# Patient Record
Sex: Male | Born: 1980 | Race: Black or African American | Hispanic: No | Marital: Married | Smoking: Never smoker
Health system: Southern US, Community
[De-identification: ages and names within clinical notes are randomized; demographics above are authoritative.]

## PROBLEM LIST (undated history)

## (undated) DIAGNOSIS — E119 Type 2 diabetes mellitus without complications: Secondary | ICD-10-CM

## (undated) DIAGNOSIS — I10 Essential (primary) hypertension: Secondary | ICD-10-CM

---

## 2012-11-14 ENCOUNTER — Encounter (HOSPITAL_COMMUNITY): Payer: Self-pay | Admitting: Emergency Medicine

## 2012-11-14 ENCOUNTER — Emergency Department (HOSPITAL_COMMUNITY)
Admission: EM | Admit: 2012-11-14 | Discharge: 2012-11-14 | Disposition: A | Discharge status: Home / Self Care | Payer: BC Managed Care – PPO | Source: Home / Self Care | Attending: Emergency Medicine | Admitting: Emergency Medicine

## 2012-11-14 DIAGNOSIS — J069 Acute upper respiratory infection, unspecified: Secondary | ICD-10-CM

## 2012-11-14 MED ORDER — SALINE NASAL SPRAY 0.65 % NA SOLN: 1.0000 | NASAL | Status: DC | PRN

## 2012-11-14 MED ORDER — CETIRIZINE-PSEUDOEPHEDRINE ER 5-120 MG PO TB12: 1.0000 | ORAL_TABLET | Freq: Two times a day (BID) | ORAL | Status: DC

## 2012-11-14 NOTE — ED Provider Notes (Signed)
History     CSN: [id number]  Arrival date & time 11/14/12  1529   First MD Initiated Contact with Patient 11/14/12 1629      Chief Complaint  Patient presents with  . URI    (Consider location/radiation/quality/duration/timing/severity/associated sxs/prior treatment) Patient is a 32 y.o. male presenting with URI. The history is provided by the patient.  URI The primary symptoms include cough. The current episode started 2 days ago. This is a new problem. The problem has not changed since onset. The cough is non-productive. There is nondescript sputum produced.  The onset of the illness is associated with exposure to sick contacts. Symptoms associated with the illness include chills, congestion and rhinorrhea. The illness is not associated with plugged ear sensation, facial pain or sinus pressure. The following treatments were addressed: Acetaminophen was effective. A decongestant was effective. Aspirin was effective. NSAIDs were effective.  Pt reports he was ill 2 days ago with abdominal pain, diarrhea and fatigue, states those symptom have improved but now has congestion with sore throat and nonproductive of cough.   Has taken nyquil and ibuprofen for symptoms with no relief.    History reviewed. No pertinent past medical history.  History reviewed. No pertinent past surgical history.  No family history on file.  History  Substance Use Topics  . Smoking status: Never Smoker   . Smokeless tobacco: Not on file  . Alcohol Use: Yes      Review of Systems  Constitutional: Positive for chills.  HENT: Positive for congestion and rhinorrhea. Negative for sinus pressure.   Respiratory: Positive for cough.   All other systems reviewed and are negative.    Allergies  Review of patient's allergies indicates no known allergies.  Home Medications   Current Outpatient Rx  Name  Route  Sig  Dispense  Refill  . CETIRIZINE-PSEUDOEPHEDRINE ER 5-120 MG PO TB12   Oral   Take 1 tablet  by mouth 2 (two) times daily.   30 tablet   1   . SALINE NASAL SPRAY 0.65 % NA SOLN   Nasal   Place 1 spray into the nose as needed for congestion.   30 mL   12     There were no vitals taken for this visit.  Physical Exam  Nursing note and vitals reviewed. Constitutional: He is oriented to person, place, and time. Vital signs are normal. He appears well-developed and well-nourished. He is active and cooperative.  HENT:  Head: Normocephalic.  Right Ear: Tympanic membrane and external ear normal.  Left Ear: Tympanic membrane and external ear normal.  Nose: Nose normal. Right sinus exhibits no maxillary sinus tenderness and no frontal sinus tenderness. Left sinus exhibits no maxillary sinus tenderness and no frontal sinus tenderness.  Mouth/Throat: Uvula is midline, oropharynx is clear and moist and mucous membranes are normal. No oropharyngeal exudate.  Eyes: Conjunctivae normal and EOM are normal. Pupils are equal, round, and reactive to light. No scleral icterus.  Neck: Trachea normal and normal range of motion. Neck supple. No thyromegaly present.  Cardiovascular: Normal rate, regular rhythm, normal heart sounds and intact distal pulses.   Pulmonary/Chest: Effort normal and breath sounds normal.  Abdominal: Soft. There is no tenderness. There is no rebound and no guarding.  Lymphadenopathy:    He has no cervical adenopathy.  Neurological: He is alert and oriented to person, place, and time. No cranial nerve deficit or sensory deficit.  Skin: Skin is warm and dry.  Psychiatric: He has a normal  mood and affect. His speech is normal and behavior is normal. Judgment and thought content normal. Cognition and memory are normal.    ED Course  Procedures (including critical care time)   Labs Reviewed  POCT RAPID STREP A ([facility] ONLY)   No results found.   1. URI (upper respiratory infection)       MDM  Rapid step negative.  Increase fluid intake, rest.  Antibiotics  not indicated.  Begin expectorate, topical decongestant, saline nasal spray and cough suppressant at bedtime. Antihistamines of your choice (Claritin or Zyrtec).  Tylenol or Motrin for fever/discomfort.  Followup with PCP if not improving 5 to 7 days.         Johnsie Kindred, NP 11/14/12 1704

## 2012-11-14 NOTE — ED Notes (Signed)
Pt c/o cold sx x3 days Sx include: abd pain that has resolved, chills, nasal congestion, sore throat, dry cough, diarrhea until yest Normal bowel movement today Denies: fevers, nauseas, vomiting Daughter had the cold last week  He is alert w/no signs of acute distress.

## 2012-11-14 NOTE — ED Provider Notes (Signed)
Medical screening examination/treatment/procedure(s) were performed by non-physician practitioner and as supervising physician I was immediately available for consultation/collaboration.  Raynald Blend, MD 11/14/12 1734

## 2013-03-04 ENCOUNTER — Encounter (HOSPITAL_COMMUNITY): Payer: Self-pay | Admitting: Emergency Medicine

## 2013-03-04 ENCOUNTER — Emergency Department (HOSPITAL_COMMUNITY)
Admission: EM | Admit: 2013-03-04 | Discharge: 2013-03-04 | Disposition: A | Discharge status: Home / Self Care | Payer: BC Managed Care – PPO | Source: Home / Self Care | Attending: Emergency Medicine | Admitting: Emergency Medicine

## 2013-03-04 ENCOUNTER — Emergency Department (INDEPENDENT_AMBULATORY_CARE_PROVIDER_SITE_OTHER): Payer: BC Managed Care – PPO

## 2013-03-04 DIAGNOSIS — S335XXA Sprain of ligaments of lumbar spine, initial encounter: Secondary | ICD-10-CM

## 2013-03-04 DIAGNOSIS — Q762 Congenital spondylolisthesis: Secondary | ICD-10-CM

## 2013-03-04 DIAGNOSIS — M4310 Spondylolisthesis, site unspecified: Secondary | ICD-10-CM

## 2013-03-04 MED ORDER — HYDROCODONE-IBUPROFEN 7.5-200 MG PO TABS: 1.0000 | ORAL_TABLET | Freq: Three times a day (TID) | ORAL | Status: DC | PRN

## 2013-03-04 NOTE — ED Provider Notes (Addendum)
History     CSN: [id number]  Arrival date & time 03/04/13  1813   First MD Initiated Contact with Patient 03/04/13 1850      Chief Complaint  Patient presents with  . Back Pain    (Consider location/radiation/quality/duration/timing/severity/associated sxs/prior treatment) HPI Comments: Patient presents urgent care this evening complaining of ongoing lower back pain. Patient describes that Saturday he recalls pulling an electrical cord and felt a pulling on his back since then he's been sore in his lower back, which he describes as a sharp type pain, when he moves or leans forward. Patient went to another urgent care was prescribed naproxen and a muscle relaxer. Although he is moving better he still feels with discomfort on his lower back. Patient denies any other associated symptoms.  Patient is a 32 y.o. male presenting with back pain. The history is provided by the patient.  Back Pain Location:  Lumbar spine Quality:  Aching Pain severity:  Moderate Onset quality:  Sudden Duration:  4 days Timing:  Constant Progression:  Unchanged Relieved by:  NSAIDs and being still Worsened by:  Movement, coughing, ambulation and bending Associated symptoms: no abdominal pain, no bladder incontinence, no bowel incontinence, no dysuria, no fever, no numbness, no paresthesias, no perianal numbness, no tingling and no weakness   Risk factors: lack of exercise and obesity   Risk factors: no hx of cancer, not pregnant and no recent surgery     History reviewed. No pertinent past medical history.  History reviewed. No pertinent past surgical history.  No family history on file.  History  Substance Use Topics  . Smoking status: Never Smoker   . Smokeless tobacco: Not on file  . Alcohol Use: Yes      Review of Systems  Constitutional: Negative for fever, chills, diaphoresis, activity change, appetite change and fatigue.  Gastrointestinal: Negative for abdominal pain and bowel  incontinence.  Genitourinary: Negative for bladder incontinence, dysuria, frequency, hematuria, flank pain, genital sores and penile pain.  Musculoskeletal: Positive for back pain.  Skin: Negative for color change, rash and wound.  Neurological: Negative for tingling, weakness, numbness and paresthesias.    Allergies  Review of patient's allergies indicates no known allergies.  Home Medications   Current Outpatient Rx  Name  Route  Sig  Dispense  Refill  . methocarbamol (ROBAXIN) 500 MG tablet   Oral   Take 500 mg by mouth 4 (four) times daily.         . cetirizine-pseudoephedrine (ZYRTEC-D) 5-120 MG per tablet   Oral   Take 1 tablet by mouth 2 (two) times daily.   30 tablet   1   . HYDROcodone-ibuprofen (VICOPROFEN) 7.5-200 MG per tablet   Oral   Take 1 tablet by mouth every 8 (eight) hours as needed for pain.   15 tablet   0   . sodium chloride (OCEAN NASAL SPRAY) 0.65 % nasal spray   Nasal   Place 1 spray into the nose as needed for congestion.   30 mL   12     BP 138/92  Pulse 91  Temp(Src) 98.2 F (36.8 C) (Oral)  Resp 18  SpO2 99%  Physical Exam  Nursing note and vitals reviewed. Constitutional: He is oriented to person, place, and time. No distress.  Musculoskeletal: He exhibits tenderness.       Lumbar back: He exhibits decreased range of motion, tenderness and bony tenderness. He exhibits no swelling, no edema, no deformity, no laceration, no spasm and  normal pulse.       Back:  Neurological: He is alert and oriented to person, place, and time. He exhibits normal muscle tone.  Skin: No rash noted. No erythema.    ED Course  Procedures (including critical care time)  Labs Reviewed - No data to display Dg Lumbar Spine Complete  03/04/2013  *RADIOLOGY REPORT*  Clinical Data: Low back pain.  LUMBAR SPINE - COMPLETE 4+ VIEW  Comparison: None.  Findings: Five lumbar type vertebral bodies are present.  There is a dysmorphic appearance of the L5  vertebra with failure fusion of the posterior elements compatible with spina bifida occulta.  There also appear to be bilateral L5 pars defects without spondylolisthesis.  Vertebral body height and intervertebral disc spaces are preserved.  Sacral arcades appear normal.  SI joints appear normal.  IMPRESSION: 1.  Bilateral L5 spondylolysis without spondylolisthesis. 2.  Spina bifida occulta at L5.   Original Report Authenticated By: Andreas Newport, M.D.      1. Lumbar sprain, initial encounter   2. Spondylolisthesis    spina bifida occulta    MDM  Lumbar sprain, patient was prescribed Vicoprofen and refer to followup with the orthopedic Dr. if pain was to persist beyond 10-14 days.        Jimmie Molly, MD 03/04/13 1936  Jimmie Molly, MD 03/04/13 2032

## 2013-03-04 NOTE — ED Notes (Signed)
Pt c/o lower right back pain onset Saturday Recalls pulling an electric cord and felt something pull on back Seen at another Urgent Care and given Naproxen and Methocarbamol; temp relief Pain is intermittent; increases w/activity and when lying down  He is alert and oriented w/no signs of acute distress.

## 2013-03-04 NOTE — Discharge Instructions (Signed)

## 2013-11-23 ENCOUNTER — Emergency Department (HOSPITAL_COMMUNITY)
Admission: EM | Admit: 2013-11-23 | Discharge: 2013-11-23 | Disposition: A | Discharge status: Home / Self Care | Payer: BC Managed Care – PPO | Source: Home / Self Care | Attending: Emergency Medicine | Admitting: Emergency Medicine

## 2013-11-23 DIAGNOSIS — L0100 Impetigo, unspecified: Secondary | ICD-10-CM

## 2013-11-23 DIAGNOSIS — B86 Scabies: Secondary | ICD-10-CM

## 2013-11-23 MED ORDER — TETANUS-DIPHTH-ACELL PERTUSSIS 5-2.5-18.5 LF-MCG/0.5 IM SUSP: 0.5000 mL | Freq: Once | INTRAMUSCULAR | Status: DC

## 2013-11-23 MED ORDER — AMOXICILLIN-POT CLAVULANATE 875-125 MG PO TABS: 1.0000 | ORAL_TABLET | Freq: Two times a day (BID) | ORAL | Status: DC

## 2013-11-23 MED ORDER — PERMETHRIN 5 % EX CREA: TOPICAL_CREAM | CUTANEOUS | Status: DC

## 2013-11-23 NOTE — ED Notes (Signed)
Patient states started with a rash in his groin area about three weeks ago Since than has spread to torso and arms Past weeks broke out with blisters to bi lateral feet Very itchy Has been using OTC remedies with no relief Today has some redness around the blisters on his feet

## 2013-11-23 NOTE — ED Provider Notes (Signed)
Medical screening examination/treatment/procedure(s) were performed by non-physician practitioner and as supervising physician I was immediately available for consultation/collaboration.  Maciej Schweitzer, M.D.  Halvor Behrend C Nitzia Perren, MD 11/23/13 2137 

## 2013-11-23 NOTE — ED Provider Notes (Signed)
CSN: [id number]     Arrival date & time 11/23/13  1520 History   First MD Initiated Contact with Patient 11/23/13 1638     Chief Complaint  Patient presents with  . Rash  . Blister   (Consider location/radiation/quality/duration/timing/severity/associated sxs/prior Treatment) HPI Comments: Patient presents with a three week history of a rash that initially began on his inner thighs, spread to groin folds, up onto his torso, down both arms and then developed on his feet. Rash is pruritic. No malaise or fever. Areas on medial feet have developed some blistering and redness, but are minimally tender. Denies any known exposures and other members of the household do not have a similar rash.  PCP: [facility]  Patient is a 33 y.o. male presenting with rash. The history is provided by the patient.  Rash   No past medical history on file. No past surgical history on file. No family history on file. History  Substance Use Topics  . Smoking status: Never Smoker   . Smokeless tobacco: Not on file  . Alcohol Use: Yes    Review of Systems  Skin: Positive for rash.  All other systems reviewed and are negative.    Allergies  Review of patient's allergies indicates no known allergies.  Home Medications   Current Outpatient Rx  Name  Route  Sig  Dispense  Refill  . cetirizine-pseudoephedrine (ZYRTEC-D) 5-120 MG per tablet   Oral   Take 1 tablet by mouth 2 (two) times daily.   30 tablet   1   . HYDROcodone-ibuprofen (VICOPROFEN) 7.5-200 MG per tablet   Oral   Take 1 tablet by mouth every 8 (eight) hours as needed for pain.   15 tablet   0   . methocarbamol (ROBAXIN) 500 MG tablet   Oral   Take 500 mg by mouth 4 (four) times daily.         . sodium chloride (OCEAN NASAL SPRAY) 0.65 % nasal spray   Nasal   Place 1 spray into the nose as needed for congestion.   30 mL   12    BP 160/98  Pulse 95  Temp(Src) 99 F (37.2 C) (Oral)  Resp 18  SpO2 99% Physical Exam   Nursing note and vitals reviewed. Constitutional: He is oriented to person, place, and time. He appears well-developed. No distress.  +obese  HENT:  Head: Normocephalic and atraumatic.  No mucous membrane lesions  Eyes: Conjunctivae are normal. Right eye exhibits no discharge. Left eye exhibits no discharge. No scleral icterus.  Cardiovascular: Normal rate.   Pulmonary/Chest: Effort normal.  Musculoskeletal: Normal range of motion.  Neurological: He is alert and oriented to person, place, and time.  Skin: Skin is warm and dry. Rash noted.  Maculopapular rash with diffuse distribution over torso, bilateral upper extremities and at feet. Heavily concentrated in groin folds, waistline, feet and anticubital fossa of both arms. At feet, lesions at medial plantar surfaces of feet are small fluid filled blisters with surrounding erythema. No induration or tenderness.   Psychiatric: He has a normal mood and affect. His behavior is normal.    ED Course  Procedures (including critical care time) Labs Review Labs Reviewed - No data to display Imaging Review No results found.  EKG Interpretation    Date/Time:    Ventricular Rate:    PR Interval:    QRS Duration:   QT Interval:    QTC Calculation:   R Axis:     Text Interpretation:  MDM  Distribution and appearance suggests scabies with areas of secondary infection at both feet. Patient is non-toxic and afebrile. Also made patient aware of elevated blood pressure and advised to follow up with PCP in 5-7 days for both BP recheck and to ensure that rash and condition of feet are improving. Elimite for scabies and augmentin for areas of secondary infection at feet. Will update tetanus status given open wounds at feet and last booster unknown.    Jess BartersJennifer Darrow [place], [place] 11/23/13 [phone]

## 2014-02-09 IMAGING — CR DG LUMBAR SPINE COMPLETE 4+V
5 series · 5 of 5 positions shown · non-contrast
Comparison: None.

CLINICAL DATA: Low back pain.

LUMBAR SPINE - COMPLETE 4+ VIEW

[view not recorded (1 of 5)]
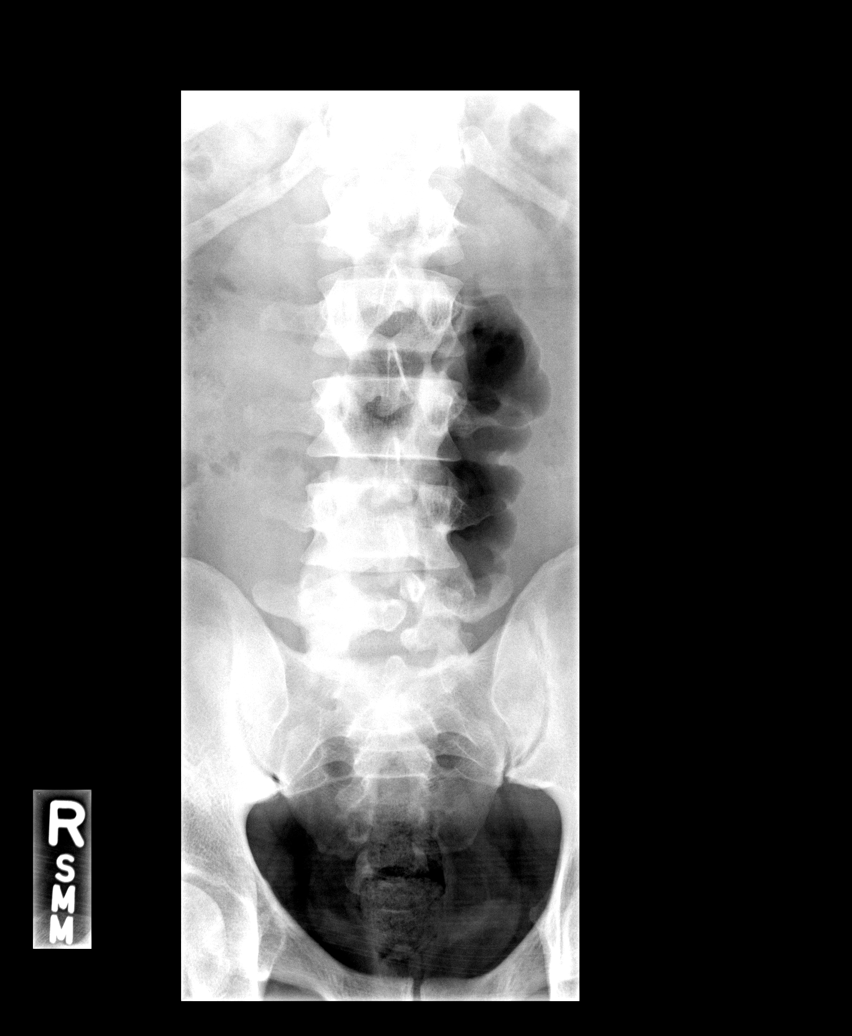

[view not recorded (2 of 5)]
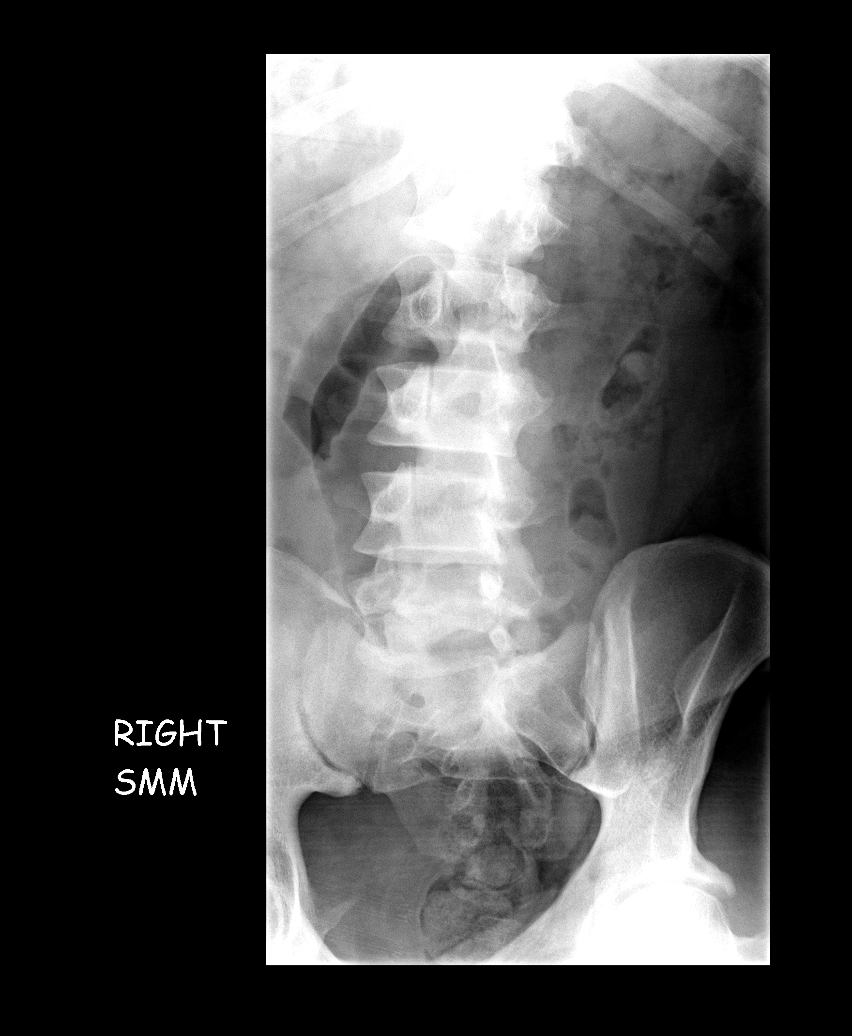

[view not recorded (3 of 5)]
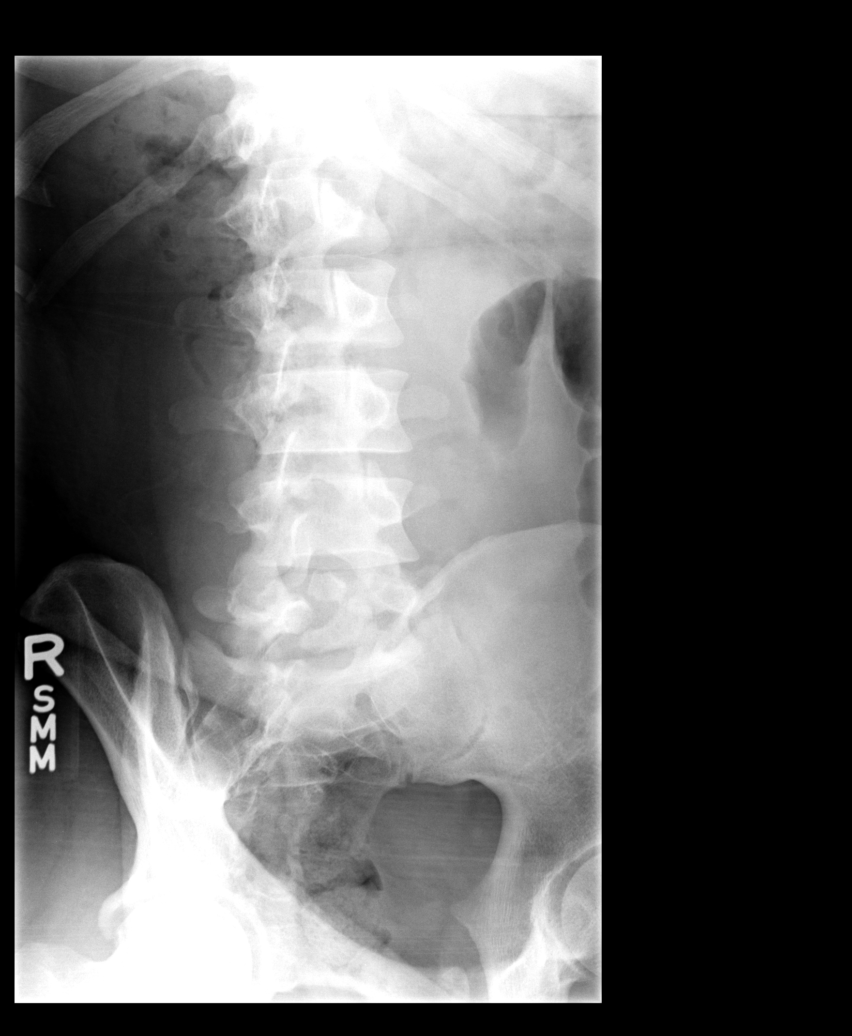

[view not recorded (4 of 5)]
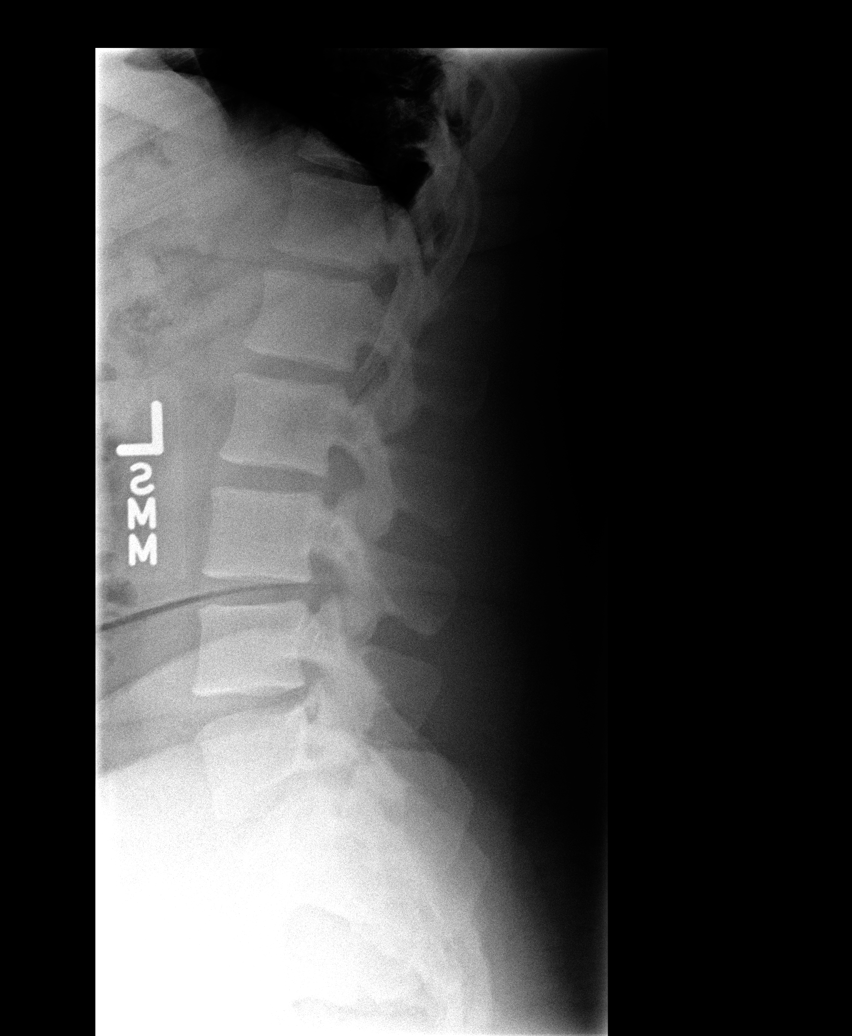

[view not recorded (5 of 5)]
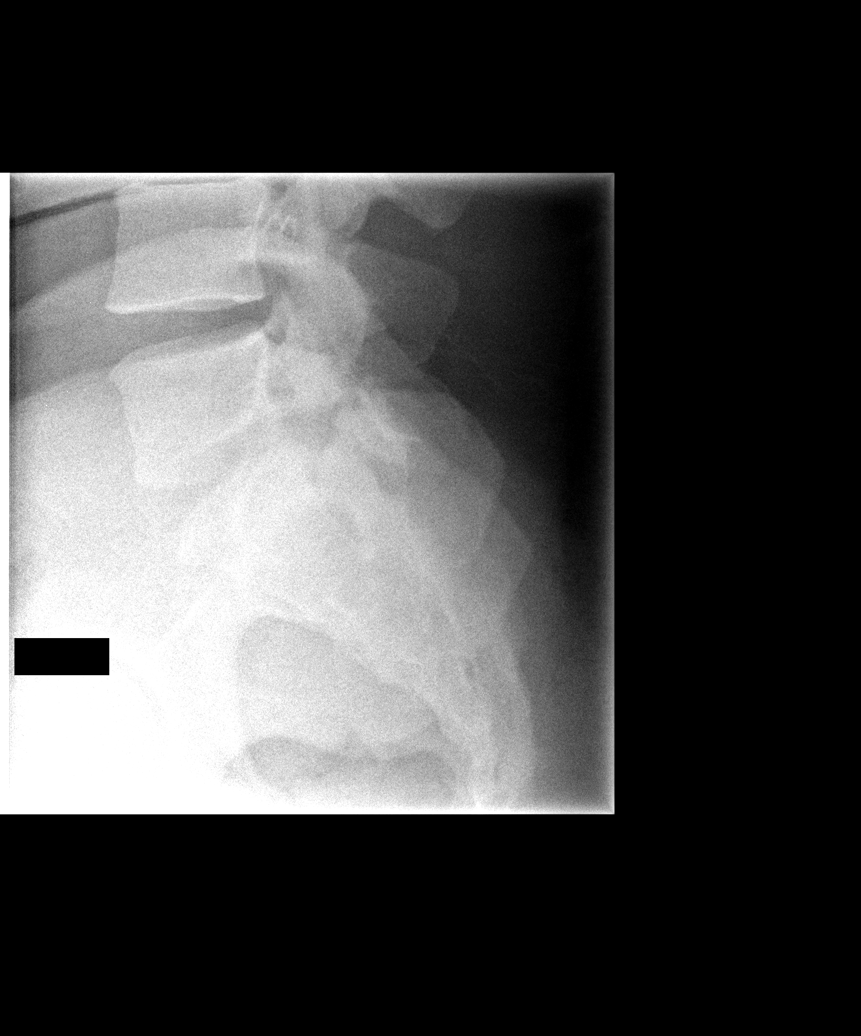

[5 of 5 positions shown; findings below may reference images not displayed]

FINDINGS: Five lumbar type vertebral bodies are present.  There is
a dysmorphic appearance of the L5 vertebra with failure fusion of
the posterior elements compatible with spina bifida occulta.  There
also appear to be bilateral L5 pars defects without
spondylolisthesis.  Vertebral body height and intervertebral disc
spaces are preserved.  Sacral arcades appear normal.  SI joints
appear normal.
IMPRESSION: 1.  Bilateral L5 spondylolysis without spondylolisthesis.
2.  Spina bifida occulta at L5.

## 2018-09-07 ENCOUNTER — Other Ambulatory Visit: Payer: Self-pay

## 2018-09-07 ENCOUNTER — Encounter (HOSPITAL_COMMUNITY): Payer: Self-pay | Admitting: *Deleted

## 2018-09-07 ENCOUNTER — Ambulatory Visit (HOSPITAL_COMMUNITY)
Admission: EM | Admit: 2018-09-07 | Discharge: 2018-09-07 | Disposition: A | Discharge status: Home / Self Care | Payer: 59 | Attending: Family Medicine | Admitting: Family Medicine

## 2018-09-07 DIAGNOSIS — K122 Cellulitis and abscess of mouth: Secondary | ICD-10-CM | POA: Diagnosis not present

## 2018-09-07 MED ORDER — TRAMADOL HCL 50 MG PO TABS: 50.0000 mg | ORAL_TABLET | Freq: Four times a day (QID) | ORAL | 0 refills | Status: DC | PRN

## 2018-09-07 MED ORDER — DOXYCYCLINE HYCLATE 100 MG PO CAPS: 100.0000 mg | ORAL_CAPSULE | Freq: Two times a day (BID) | ORAL | 0 refills | Status: DC

## 2018-09-07 NOTE — ED Provider Notes (Signed)
[facility]    CSN: [id number] Arrival date & time: 09/07/18  1427     History   Chief Complaint No chief complaint on file.   HPI Jonathan Delgado is a 37 y.o. male.   HPI  Patient complains of worsening right upper jaw and facial pain.  He reports needing dental work and has scheduled an appointment with dentist.  Next available appointment is scheduled for several weeks out.  He is concerned for having a gum abscess.  He has a broken tooth which was previously filled in the filling came out well back.  He never went to have this repaired. Affected tooth is an upper right molar.  Pain is about a 5-6 out of 10 although at night worsens.  He denies fever although has had some chills and complains of just not feeling well and increased fatigued. History reviewed. No pertinent past medical history.  There are no active problems to display for this patient.   History reviewed. No pertinent surgical history.     Home Medications    Prior to Admission medications   Medication Sig Start Date End Date Taking? Authorizing Provider  cetirizine-pseudoephedrine (ZYRTEC-D) 5-120 MG per tablet Take 1 tablet by mouth 2 (two) times daily. 11/14/12   Chatten, Katherine Basset, NP  doxycycline (VIBRAMYCIN) 100 MG capsule Take 1 capsule (100 mg total) by mouth 2 (two) times daily. 09/07/18   Bing Neighbors, FNP  HYDROcodone-ibuprofen (VICOPROFEN) 7.5-200 MG per tablet Take 1 tablet by mouth every 8 (eight) hours as needed for pain. 03/04/13   Jimmie Molly, MD  methocarbamol (ROBAXIN) 500 MG tablet Take 500 mg by mouth 4 (four) times daily.    [provider]  permethrin (ELIMITE) 5 % cream Apply to affected area once from chin to toes, leave eight hours then shower. Repeat treatment x 1 one week after initial treatment 11/23/13   Ria Clock, PA  sodium chloride (OCEAN NASAL SPRAY) 0.65 % nasal spray Place 1 spray into the nose as needed for congestion. 11/14/12   Chatten, Katherine Basset,  NP  traMADol (ULTRAM) 50 MG tablet Take 1 tablet (50 mg total) by mouth every 6 (six) hours as needed. 09/07/18   Bing Neighbors, FNP    Family History History reviewed. No pertinent family history.  Social History Social History   Tobacco Use  . Smoking status: Never Smoker  . Smokeless tobacco: Never Used  Substance Use Topics  . Alcohol use: Yes  . Drug use: No     Allergies   Patient has no known allergies.   Review of Systems Review of Systems Pertinent negatives listed in HPI  Physical Exam Triage Vital Signs ED Triage Vitals  Enc Vitals Group     BP 09/07/18 1557 (!) 161/100     Pulse Rate 09/07/18 1557 (!) 106     Resp 09/07/18 1557 18     Temp 09/07/18 1557 98.8 F (37.1 C)     Temp Source 09/07/18 1557 Oral     SpO2 09/07/18 1557 100 %     Weight --      Height --      Head Circumference --      Peak Flow --      Pain Score 09/07/18 1552 6     Pain Loc --      Pain Edu? --      Excl. in GC? --    No data found.  Updated Vital Signs BP Marland Kitchen)  161/100 (BP Location: Left Arm)   Pulse (!) 106   Temp 98.8 F (37.1 C) (Oral)   Resp 18   SpO2 100%   Visual Acuity Right Eye Distance:   Left Eye Distance:   Bilateral Distance:    Right Eye Near:   Left Eye Near:    Bilateral Near:     Physical Exam General appearance: alert, well developed, well nourished, cooperative and in no distress Head: Normocephalic, without obvious abnormality, atraumatic Heart:Tachycardia. Negative for murmur or gallops. Respiratory: Respirations even and unlabored, normal respiratory rate Extremities: No gross deformities Skin: Skin color, texture, turgor normal. No rashes seen  Psych: Appropriate mood and affect. Neurologic: Mental status: Alert, oriented to person, place, and time, thought content appropriate.  UC Treatments / Results  Labs (all labs ordered are listed, but only abnormal results are displayed) Labs Reviewed - No data to  display  EKG None  Radiology No results found.  Procedures Procedures (including critical care time)  Medications Ordered in UC Medications - No data to display  Initial Impression / Assessment and Plan / UC Course  I have reviewed the triage vital signs and the nursing notes.  Pertinent labs & imaging results that were available during my care of the patient were reviewed by me and considered in my medical decision making (see chart for details).   Patient presents for acute dental pain. He has very poor dentition. There is a small abscess noted above the gum of the right upper molar tooth number #3 and #4. Will treat with a broad-spectrum antibiotic. Continue naproxen or ibuprofen for pain (not both). Short course of Tramadol prescribed to help manage severe pain only. An After Visit Summary was printed and given to the patient/family. Precautions discussed. Red flags discussed. Questions invited and answered.They voiced understanding and agreement.  Final Clinical Impressions(s) / UC Diagnoses   Final diagnoses:  Oral abscess     Discharge Instructions     Follow-up with dentist if no improvement.    ED Prescriptions    Medication Sig Dispense Auth. Provider   traMADol (ULTRAM) 50 MG tablet  (Status: Discontinued) Take 1 tablet (50 mg total) by mouth every 6 (six) hours as needed. 15 tablet Bing Neighbors, FNP   traMADol (ULTRAM) 50 MG tablet Take 1 tablet (50 mg total) by mouth every 6 (six) hours as needed. 15 tablet Bing Neighbors, FNP   doxycycline (VIBRAMYCIN) 100 MG capsule Take 1 capsule (100 mg total) by mouth 2 (two) times daily. 20 capsule Bing Neighbors, FNP     Controlled Substance Prescriptions [place] Controlled Substance Registry consulted? Yes, I have consulted the [place] Controlled Substances Registry for this patient, and feel the risk/benefit ratio today is favorable for proceeding with this prescription for a controlled substance.   Bing Neighbors, FNP 09/09/18 [phone]

## 2018-09-07 NOTE — Discharge Instructions (Signed)
Follow up with dentist if no improvement. °

## 2018-09-07 NOTE — ED Triage Notes (Signed)
Started Thursday morning, cold sweats, right side of face severe pain,

## 2023-12-31 ENCOUNTER — Telehealth (HOSPITAL_COMMUNITY): Payer: Self-pay

## 2023-12-31 ENCOUNTER — Ambulatory Visit (HOSPITAL_COMMUNITY)
Admission: EM | Admit: 2023-12-31 | Discharge: 2023-12-31 | Disposition: A | Discharge status: Home / Self Care | Payer: 59 | Attending: Family Medicine | Admitting: Family Medicine

## 2023-12-31 ENCOUNTER — Encounter (HOSPITAL_COMMUNITY): Payer: Self-pay | Admitting: Emergency Medicine

## 2023-12-31 DIAGNOSIS — J019 Acute sinusitis, unspecified: Secondary | ICD-10-CM | POA: Diagnosis not present

## 2023-12-31 HISTORY — DX: Essential (primary) hypertension: I10

## 2023-12-31 HISTORY — DX: Type 2 diabetes mellitus without complications: E11.9

## 2023-12-31 MED ORDER — CEFDINIR 300 MG PO CAPS: 600.0000 mg | ORAL_CAPSULE | Freq: Every day | ORAL | 0 refills | Status: AC

## 2023-12-31 MED ORDER — BENZONATATE 100 MG PO CAPS: 100.0000 mg | ORAL_CAPSULE | Freq: Three times a day (TID) | ORAL | 0 refills | Status: DC | PRN

## 2023-12-31 MED ORDER — BENZONATATE 100 MG PO CAPS: 100.0000 mg | ORAL_CAPSULE | Freq: Three times a day (TID) | ORAL | 0 refills | Status: AC | PRN

## 2023-12-31 MED ORDER — PREDNISONE 20 MG PO TABS: 40.0000 mg | ORAL_TABLET | Freq: Every day | ORAL | 0 refills | Status: DC

## 2023-12-31 MED ORDER — CEFDINIR 300 MG PO CAPS: 600.0000 mg | ORAL_CAPSULE | Freq: Every day | ORAL | 0 refills | Status: DC

## 2023-12-31 MED ORDER — PREDNISONE 20 MG PO TABS: 40.0000 mg | ORAL_TABLET | Freq: Every day | ORAL | 0 refills | Status: AC

## 2023-12-31 NOTE — ED Triage Notes (Signed)
 Pt reports for a couple weeks having right side headache and sinus congestion, then next week felt like he had the flu. Acetaminophen for the pains.  Saturday starting having left sided headache. Hasn't taken anything for pain today. Pt reports lots of pain behind left eye.   Denies any visual impairment.

## 2023-12-31 NOTE — ED Notes (Signed)
 [facility] on [address] is closed. Patient requested we call in medication to [facility] on [address].

## 2023-12-31 NOTE — ED Provider Notes (Signed)
 [facility]    CSN: [id number] Arrival date & time: 12/31/23  1549      History   Chief Complaint Chief Complaint  Patient presents with   Headache    HPI Nussen Pullin is a 43 y.o. male.    Headache Here for sinus pressure and headache, postnasal drainage and rhinorrhea.  Symptoms began about 2 weeks ago.  At first he had cough and some fever.  That has improved he had some chest congestion but never felt tight or wheezy.   NKDA He does have a history of diabetes but sugars well-controlled   Past Medical History:  Diagnosis Date   Diabetes mellitus without complication (HCC)    Hypertension     There are no active problems to display for this patient.   History reviewed. No pertinent surgical history.     Home Medications    Prior to Admission medications   Medication Sig Start Date End Date Taking? Authorizing Provider  benzonatate (TESSALON) 100 MG capsule Take 1 capsule (100 mg total) by mouth 3 (three) times daily as needed for cough. 12/31/23  Yes Zenia Resides, MD  cefdinir (OMNICEF) 300 MG capsule Take 2 capsules (600 mg total) by mouth daily for 7 days. 12/31/23 01/07/24 Yes Zenia Resides, MD  predniSONE (DELTASONE) 20 MG tablet Take 2 tablets (40 mg total) by mouth daily with breakfast for 3 days. 12/31/23 01/03/24 Yes Zenia Resides, MD    Family History No family history on file.  Social History Social History   Tobacco Use   Smoking status: Never   Smokeless tobacco: Never  Substance Use Topics   Alcohol use: Yes   Drug use: No     Allergies   Patient has no known allergies.   Review of Systems Review of Systems  Neurological:  Positive for headaches.     Physical Exam Triage Vital Signs ED Triage Vitals  Encounter Vitals Group     BP 12/31/23 1659 (!) 175/113     Systolic BP Percentile --      Diastolic BP Percentile --      Pulse Rate 12/31/23 1659 98     Resp 12/31/23 1659 15     Temp 12/31/23 1659 98  F (36.7 C)     Temp Source 12/31/23 1659 Oral     SpO2 12/31/23 1659 97 %     Weight --      Height --      Head Circumference --      Peak Flow --      Pain Score 12/31/23 1658 6     Pain Loc --      Pain Education --      Exclude from Growth Chart --    No data found.  Updated Vital Signs BP (!) 175/113 (BP Location: Right Arm) Comment: hasnt taken HTN meds since last year  Pulse 98   Temp 98 F (36.7 C) (Oral)   Resp 15   SpO2 97%   Visual Acuity Right Eye Distance:   Left Eye Distance:   Bilateral Distance:    Right Eye Near:   Left Eye Near:    Bilateral Near:     Physical Exam Vitals reviewed.  Constitutional:      General: He is not in acute distress.    Appearance: He is not ill-appearing, toxic-appearing or diaphoretic.  HENT:     Right Ear: Tympanic membrane and ear canal normal.     Left  Ear: Tympanic membrane and ear canal normal.     Nose: Congestion present.     Mouth/Throat:     Mouth: Mucous membranes are moist.     Comments: White mucus is draining Eyes:     Extraocular Movements: Extraocular movements intact.     Conjunctiva/sclera: Conjunctivae normal.     Pupils: Pupils are equal, round, and reactive to light.  Cardiovascular:     Rate and Rhythm: Normal rate and regular rhythm.     Heart sounds: No murmur heard. Pulmonary:     Effort: Pulmonary effort is normal. No respiratory distress.     Breath sounds: Normal breath sounds. No stridor. No wheezing, rhonchi or rales.  Musculoskeletal:     Cervical back: Neck supple.  Lymphadenopathy:     Cervical: No cervical adenopathy.  Skin:    Capillary Refill: Capillary refill takes less than 2 seconds.     Coloration: Skin is not jaundiced or pale.  Neurological:     General: No focal deficit present.     Mental Status: He is alert and oriented to person, place, and time.  Psychiatric:        Behavior: Behavior normal.      UC Treatments / Results  Labs (all labs ordered are listed,  but only abnormal results are displayed) Labs Reviewed - No data to display  EKG   Radiology No results found.  Procedures Procedures (including critical care time)  Medications Ordered in UC Medications - No data to display  Initial Impression / Assessment and Plan / UC Course  I have reviewed the triage vital signs and the nursing notes.  Pertinent labs & imaging results that were available during my care of the patient were reviewed by me and considered in my medical decision making (see chart for details).     Omnicef is sent in for acute sinusitis and 3 days of prednisone for the sinus pressure. He is aware that that could raise his sugars.  Tessalon Perles are sent in for the cough Final Clinical Impressions(s) / UC Diagnoses   Final diagnoses:  Acute sinusitis, recurrence not specified, unspecified location     Discharge Instructions      Take cefdinir 300 mg--2 capsules together daily for 7 days  Take prednisone 20 mg--2 daily for 3 days  Take benzonatate 100 mg, 1 tab every 8 hours as needed for cough.       ED Prescriptions     Medication Sig Dispense Auth. Provider   cefdinir (OMNICEF) 300 MG capsule Take 2 capsules (600 mg total) by mouth daily for 7 days. 14 capsule Zenia Resides, MD   predniSONE (DELTASONE) 20 MG tablet Take 2 tablets (40 mg total) by mouth daily with breakfast for 3 days. 6 tablet Laprecious Austill, Janace Aris, MD   benzonatate (TESSALON) 100 MG capsule Take 1 capsule (100 mg total) by mouth 3 (three) times daily as needed for cough. 21 capsule Zenia Resides, MD      PDMP not reviewed this encounter.   Zenia Resides, MD 12/31/23 [phone]

## 2023-12-31 NOTE — Discharge Instructions (Addendum)
 Take cefdinir 300 mg--2 capsules together daily for 7 days  Take prednisone 20 mg--2 daily for 3 days  Take benzonatate 100 mg, 1 tab every 8 hours as needed for cough.

## 2024-07-18 ENCOUNTER — Telehealth: Payer: Self-pay

## 2024-07-18 NOTE — Telephone Encounter (Signed)
 Copied from CRM #[id number]. Topic: Appointments - Appointment Info/Confirmation >> Jul 17, 2024  2:04 PM Rosina BIRCH wrote: Patient/patient representative is calling for information regarding an appointment.   Patient called stating he got a call from the office from [facility] but he is not a patient [phone]

## 2024-07-21 NOTE — Telephone Encounter (Signed)
 No open note may have been error.
